# Patient Record
Sex: Female | Born: 2000 | Race: White | Hispanic: No | Marital: Single | Smoking: Never smoker
Health system: Southern US, Community
[De-identification: ages and names within clinical notes are randomized; demographics above are authoritative.]

## PROBLEM LIST (undated history)

## (undated) HISTORY — PX: WRIST SURGERY: SHX841

---
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
## 2019-12-29 ENCOUNTER — Ambulatory Visit: Payer: 59 | Admitting: Physician Assistant
